# Patient Record
Sex: Female | Born: 1980 | Race: White | Hispanic: No | Marital: Single | Smoking: Former smoker
Health system: Southern US, Community
[De-identification: ages and names within clinical notes are randomized; demographics above are authoritative.]

## PROBLEM LIST (undated history)

## (undated) DIAGNOSIS — F419 Anxiety disorder, unspecified: Secondary | ICD-10-CM

## (undated) DIAGNOSIS — F4310 Post-traumatic stress disorder, unspecified: Secondary | ICD-10-CM

## (undated) HISTORY — PX: WISDOM TOOTH EXTRACTION: SHX21

---

## 2017-02-20 ENCOUNTER — Emergency Department: Payer: Self-pay

## 2017-02-20 ENCOUNTER — Emergency Department
Admission: EM | Admit: 2017-02-20 | Discharge: 2017-02-20 | Disposition: A | Discharge status: Home / Self Care | Payer: Self-pay | Attending: Emergency Medicine | Admitting: Emergency Medicine

## 2017-02-20 ENCOUNTER — Other Ambulatory Visit: Payer: Self-pay

## 2017-02-20 ENCOUNTER — Encounter: Payer: Self-pay | Admitting: Emergency Medicine

## 2017-02-20 DIAGNOSIS — J209 Acute bronchitis, unspecified: Secondary | ICD-10-CM | POA: Insufficient documentation

## 2017-02-20 DIAGNOSIS — Z87891 Personal history of nicotine dependence: Secondary | ICD-10-CM | POA: Insufficient documentation

## 2017-02-20 HISTORY — DX: Anxiety disorder, unspecified: F41.9

## 2017-02-20 HISTORY — DX: Post-traumatic stress disorder, unspecified: F43.10

## 2017-02-20 LAB — POCT PREGNANCY, URINE: Preg Test, Ur: NEGATIVE

## 2017-02-20 MED ORDER — BENZONATATE 100 MG PO CAPS
100.0000 mg | ORAL_CAPSULE | Freq: Once | ORAL | Status: AC
  Administered 2017-02-20: 100 mg via ORAL
  Filled 2017-02-20: qty 1

## 2017-02-20 MED ORDER — ALBUTEROL SULFATE HFA 108 (90 BASE) MCG/ACT IN AERS: 2.0000 | INHALATION_SPRAY | Freq: Four times a day (QID) | RESPIRATORY_TRACT | 0 refills | Status: DC | PRN

## 2017-02-20 MED ORDER — BENZONATATE 100 MG PO CAPS: 100.0000 mg | ORAL_CAPSULE | Freq: Three times a day (TID) | ORAL | 0 refills | Status: AC | PRN

## 2017-02-20 MED ORDER — AZITHROMYCIN 500 MG PO TABS
500.0000 mg | ORAL_TABLET | Freq: Once | ORAL | Status: AC
  Administered 2017-02-20: 500 mg via ORAL
  Filled 2017-02-20: qty 1

## 2017-02-20 MED ORDER — AZITHROMYCIN 250 MG PO TABS: ORAL_TABLET | ORAL | 0 refills | Status: AC

## 2017-02-20 NOTE — ED Triage Notes (Signed)
Pt presents to ED with non-productive cough and congestion intermittently for the past month. denies fever. States she just isn't feeling better. Pt currently has no increased work of breathing or acute distress noted at this time.

## 2017-02-20 NOTE — ED Notes (Signed)

## 2017-02-21 NOTE — ED Provider Notes (Signed)
[facility] Emergency Department Provider Note    First MD Initiated Contact with Patient 02/20/17 [phone]     (approximate)  I have reviewed the triage vital signs and the nursing notes.   HISTORY  Chief Complaint Cough   HPI Kathryn Perkins is a 36 y.o. female emergency department with intermittent nonproductive cough and congestion times 1 month.  Patient denies any fever.  Patient denies any chest pain.  Patient denies any lower extremity pain or swelling.  Patient does admit to using Vapor cigarette.   Past Medical History:  Diagnosis Date  . Anxiety   . PTSD (post-traumatic stress disorder)     There are no active problems to display for this patient.   Past Surgical History:  Procedure Laterality Date  . WISDOM TOOTH EXTRACTION      Prior to Admission medications   Medication Sig Start Date End Date Taking? Authorizing Provider  albuterol (PROVENTIL HFA;VENTOLIN HFA) 108 (90 Base) MCG/ACT inhaler Inhale 2 puffs every 6 (six) hours as needed into the lungs for wheezing or shortness of breath. 02/20/17   Darci CurrentBrown, [place] N, MD  azithromycin (ZITHROMAX Z-PAK) 250 MG tablet Take 2 tablets (500 mg) on  Day 1,  followed by 1 tablet (250 mg) once daily on Days 2 through 5. 02/20/17 02/25/17  Darci CurrentBrown, [place] N, MD  benzonatate (TESSALON PERLES) 100 MG capsule Take 1 capsule (100 mg total) 3 (three) times daily as needed for up to 10 days by mouth for cough. 02/20/17 03/02/17  Darci CurrentBrown, [place] N, MD    Allergies Sulfa antibiotics  History reviewed. No pertinent family history.  Social History Social History   Tobacco Use  . Smoking status: Former Games developermoker  . Smokeless tobacco: Never Used  Substance Use Topics  . Alcohol use: No    Frequency: Never  . Drug use: No    Review of Systems Constitutional: No fever/chills Eyes: No visual changes. ENT: No sore throat.  Positive for nasal congestion Cardiovascular: Denies chest pain. Respiratory:  Denies shortness of breath.  Positive for cough Gastrointestinal: No abdominal pain.  No nausea, no vomiting.  No diarrhea.  No constipation. Genitourinary: Negative for dysuria. Musculoskeletal: Negative for neck pain.  Negative for back pain. Integumentary: Negative for rash. Neurological: Negative for headaches, focal weakness or numbness.   ____________________________________________   PHYSICAL EXAM:  VITAL SIGNS: ED Triage Vitals  Enc Vitals Group     BP 02/20/17 0318 128/77     Pulse Rate 02/20/17 0318 (!) 101     Resp 02/20/17 0318 16     Temp 02/20/17 0318 98.1 F (36.7 C)     Temp Source 02/20/17 0318 Oral     SpO2 02/20/17 0318 98 %     Weight 02/20/17 0319 95.3 kg (210 lb)     Height 02/20/17 0319 1.676 m (5\' 6" )     Head Circumference --      Peak Flow --      Pain Score 02/20/17 0318 4     Pain Loc --      Pain Edu? --      Excl. in GC? --     Constitutional: Alert and oriented. Well appearing and in no acute distress. Eyes: Conjunctivae are normal. Head: Atraumatic. Nose: No congestion/rhinnorhea. Mouth/Throat: Mucous membranes are moist.  Oropharynx non-erythematous. Neck: No stridor.  No meningeal signs.   Cardiovascular: Normal rate, regular rhythm. Good peripheral circulation. Grossly normal heart sounds. Respiratory: Normal respiratory effort.  No retractions.  Lungs CTAB. Gastrointestinal: Soft and nontender. No distention.  Musculoskeletal: No lower extremity tenderness nor edema. No gross deformities of extremities. Neurologic:  Normal speech and language. No gross focal neurologic deficits are appreciated.  Skin:  Skin is warm, dry and intact. No rash noted. Psychiatric: Mood and affect are normal. Speech and behavior are normal.  ____________________________________________   LABS (all labs ordered are listed, but only abnormal results are displayed)  Labs Reviewed  POC URINE PREG, ED  POCT PREGNANCY, URINE    _____________________________  RADIOLOGY I, Eleva N BROWN, personally viewed and evaluated these images (plain radiographs) as part of my medical decision making, as well as reviewing the written report by the radiologist.  CLINICAL DATA:  36 year old female with cough.  EXAM: CHEST  2 VIEW  COMPARISON:  None.  FINDINGS: The heart size and mediastinal contours are within normal limits. Both lungs are clear. The visualized skeletal structures are unremarkable.  IMPRESSION: No active cardiopulmonary disease.   Electronically Signed   By: Elgie CollardArash  Radparvar M.D.   On: 02/20/2017 03:51 ________________  Procedures   ____________________________________________   INITIAL IMPRESSION / ASSESSMENT AND PLAN / ED COURSE  As part of my medical decision making, I reviewed the following data within the electronic MEDICAL RECORD [id number] year old female presenting with intermittent cough times 1 month.  Patient afebrile on presentation.  Suspect possible chronic bronchitis as etiology for the patient's cough.  Consider the possibility of pneumonia chest x-ray performed revealed no acute findings.  Also considered possibility of pertussis.  Patient given azithromycin in the emergency department and will be prescribed the same for home ____________________________________________  FINAL CLINICAL IMPRESSION(S) / ED DIAGNOSES  Final diagnoses:  Acute bronchitis, unspecified organism     MEDICATIONS GIVEN DURING THIS VISIT:  Medications  azithromycin (ZITHROMAX) tablet 500 mg (500 mg Oral Given 02/20/17 0509)  benzonatate (TESSALON) capsule 100 mg (100 mg Oral Given 02/20/17 0509)     ED Discharge Orders        Ordered    azithromycin (ZITHROMAX Z-PAK) 250 MG tablet     02/20/17 0442    benzonatate (TESSALON PERLES) 100 MG capsule  3 times daily PRN     02/20/17 0442    albuterol (PROVENTIL HFA;VENTOLIN HFA) 108 (90 Base) MCG/ACT inhaler  Every 6 hours PRN     02/20/17  0442       Note:  This document was prepared using Dragon voice recognition software and may include unintentional dictation errors.    Darci CurrentBrown, [place] N, MD 02/21/17 [phone]

## 2018-05-09 IMAGING — CR DG CHEST 2V
2 series · 2 of 2 positions shown · non-contrast
Comparison: None.

CLINICAL DATA: 36-year-old female with cough.

EXAM:
CHEST  2 VIEW

[chest pa]
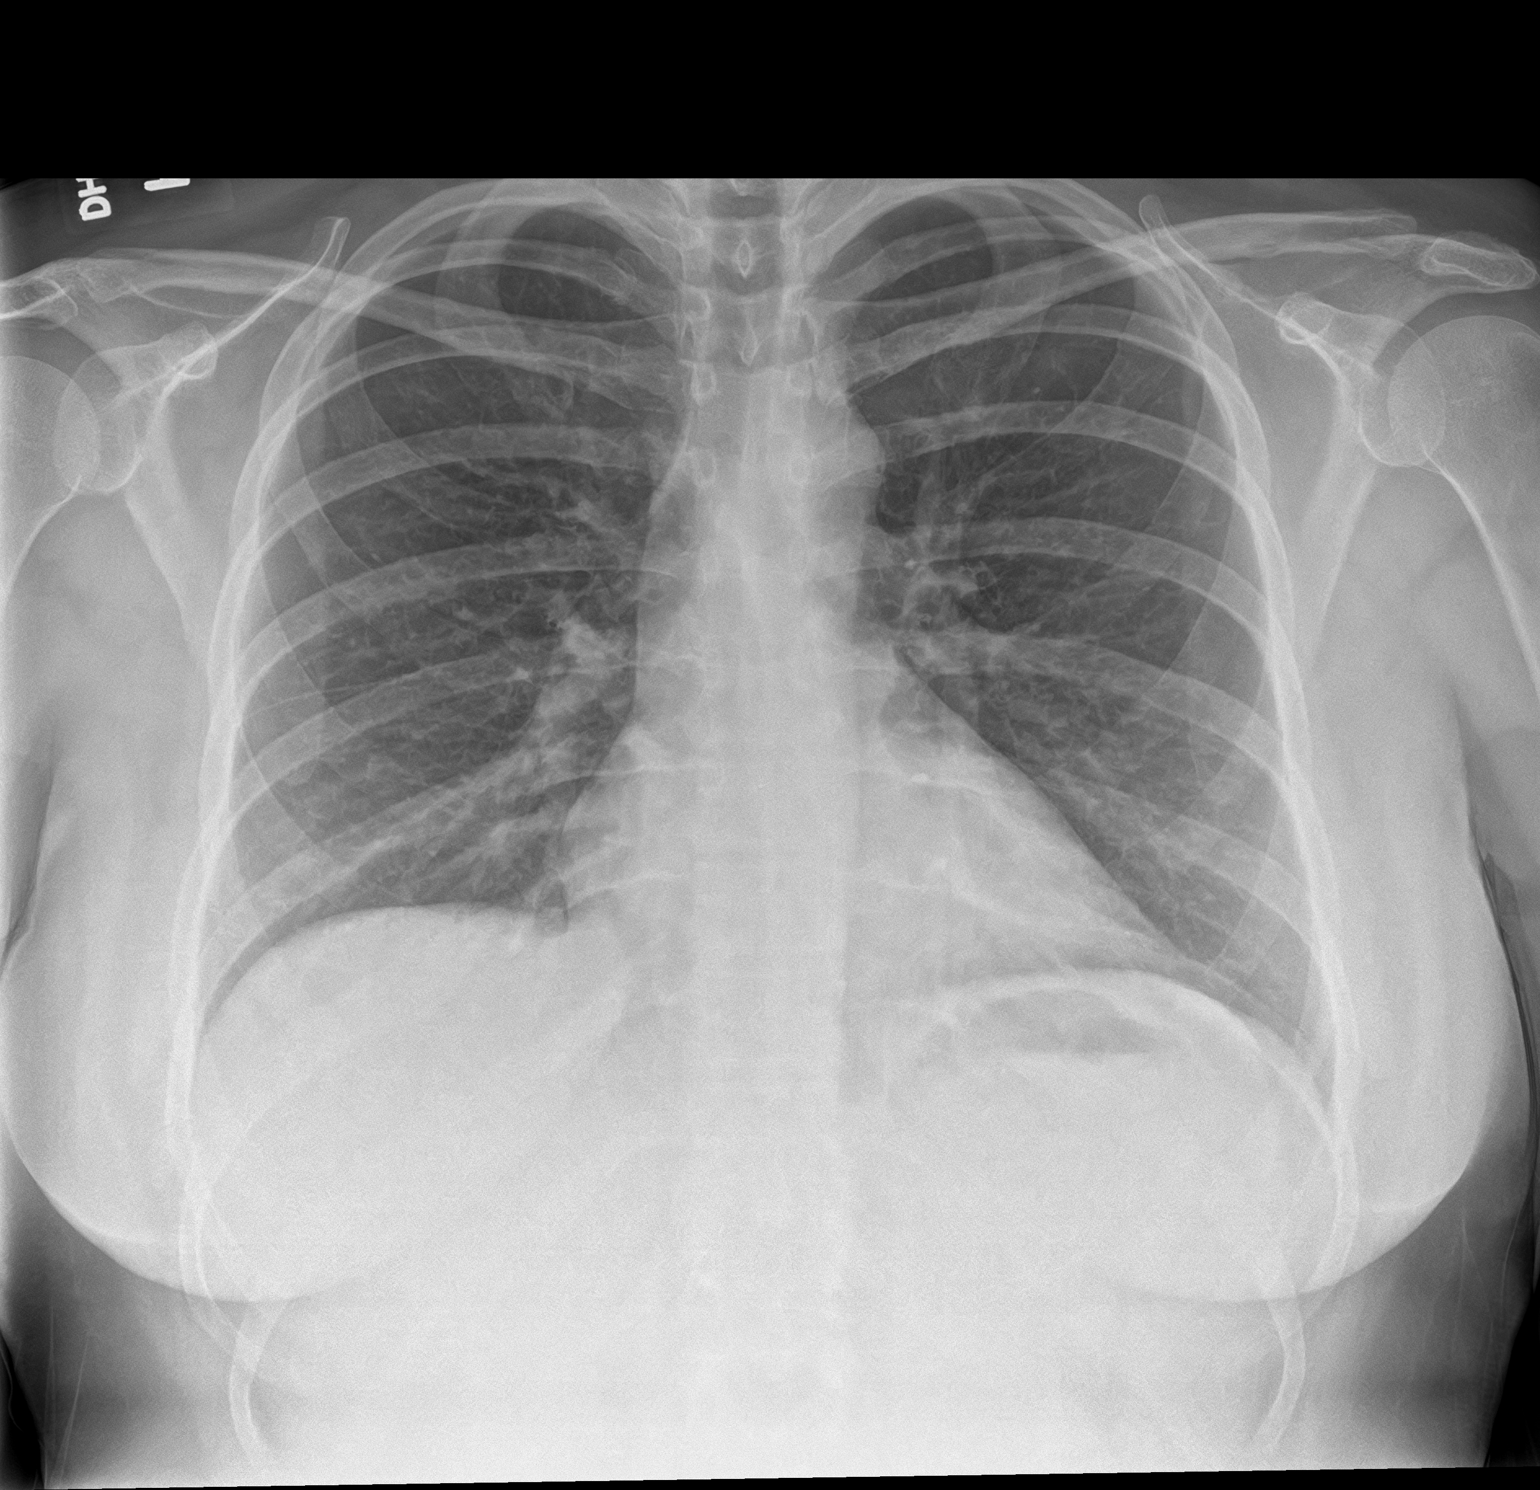

[chest lat]
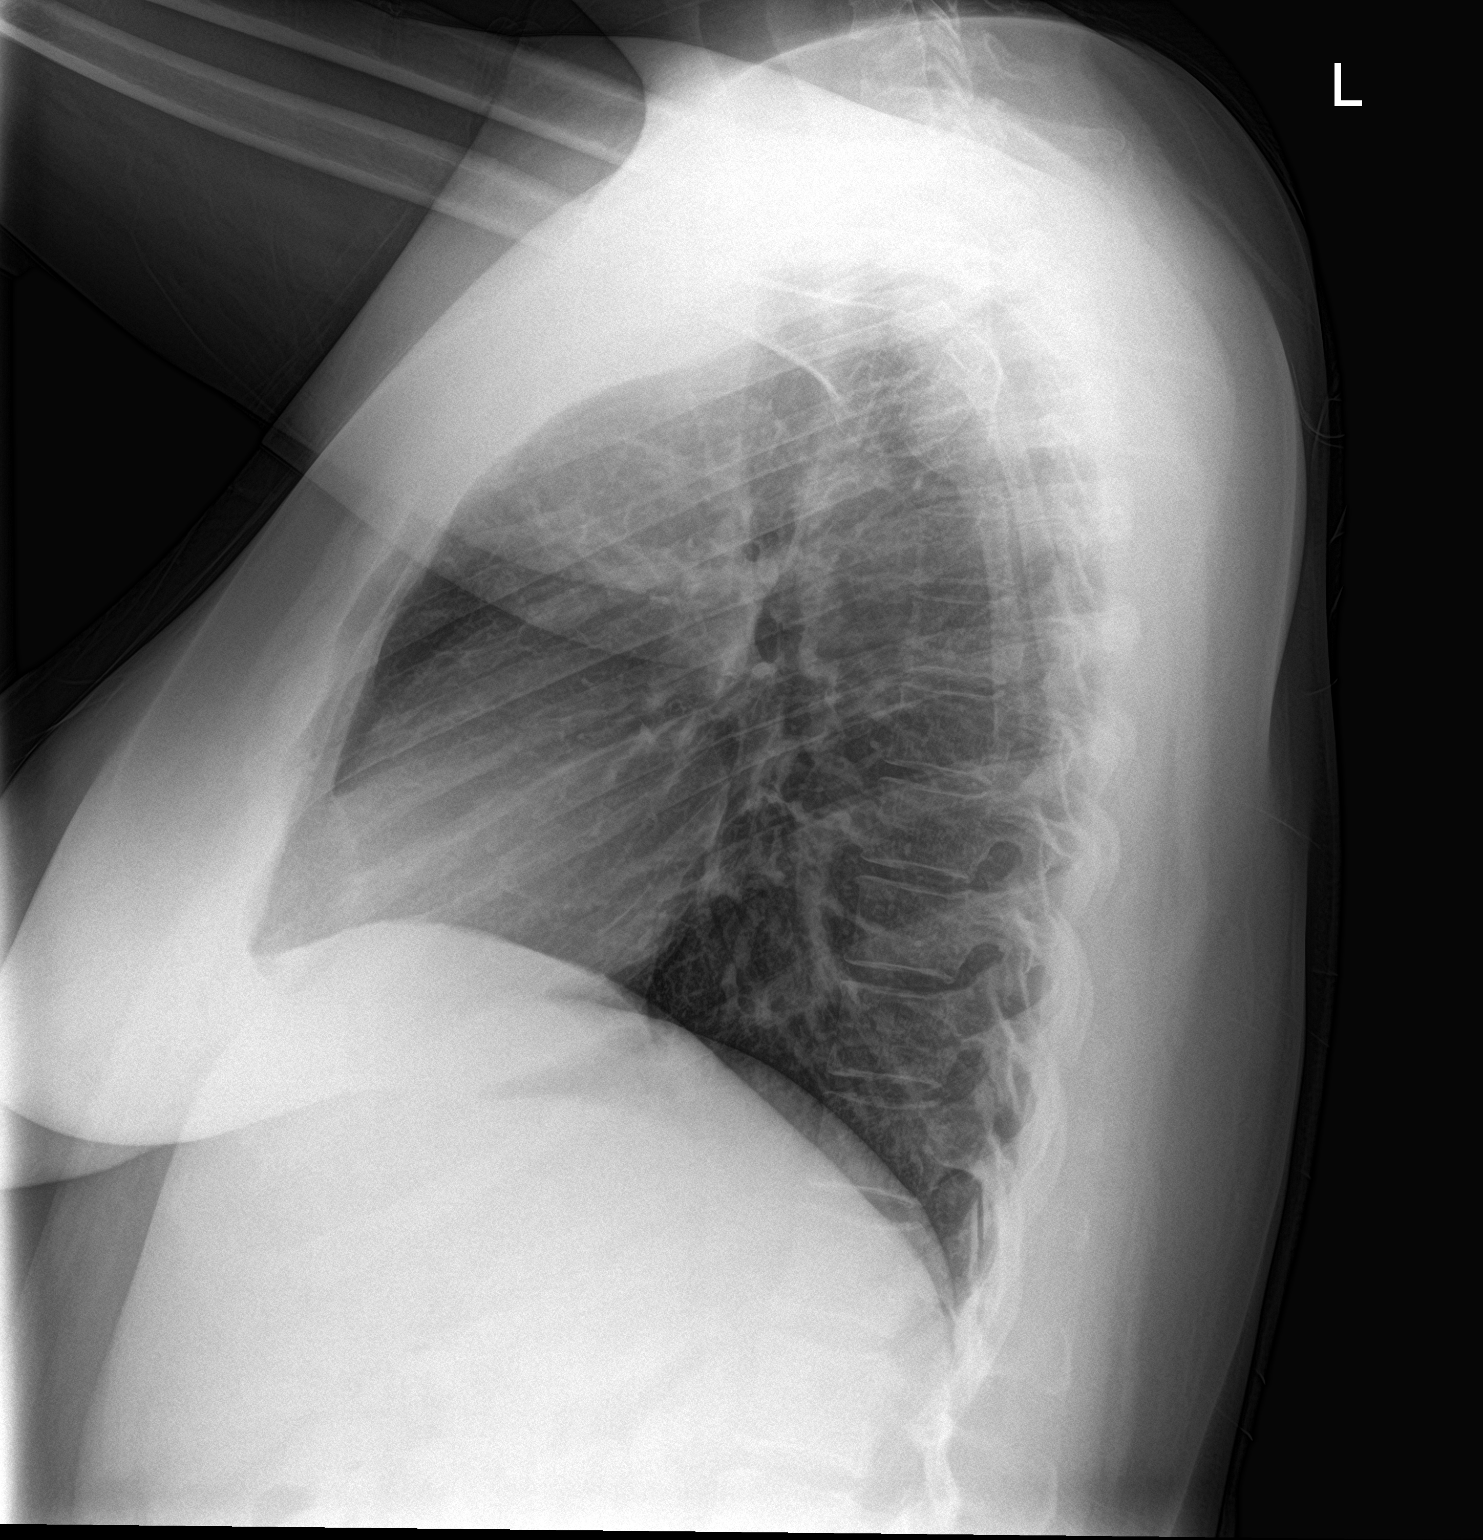

[2 of 2 positions shown; findings below may reference images not displayed]

FINDINGS: The heart size and mediastinal contours are within normal limits.
Both lungs are clear. The visualized skeletal structures are
unremarkable.
IMPRESSION: No active cardiopulmonary disease.

## 2018-07-10 ENCOUNTER — Telehealth: Payer: Self-pay | Admitting: Physician Assistant

## 2018-07-10 DIAGNOSIS — B349 Viral infection, unspecified: Secondary | ICD-10-CM

## 2018-07-10 MED ORDER — BENZONATATE 100 MG PO CAPS: 100.0000 mg | ORAL_CAPSULE | Freq: Three times a day (TID) | ORAL | 0 refills | Status: AC | PRN

## 2018-07-10 MED ORDER — ALBUTEROL SULFATE HFA 108 (90 BASE) MCG/ACT IN AERS: 2.0000 | INHALATION_SPRAY | Freq: Four times a day (QID) | RESPIRATORY_TRACT | 0 refills | Status: AC | PRN

## 2018-07-10 NOTE — Progress Notes (Signed)
I have spent 5 minutes in review of e-visit questionnaire, review and updating patient chart, medical decision making and response to patient.   Vamsi Apfel Cody Dante Roudebush, PA-C    

## 2018-07-10 NOTE — Progress Notes (Signed)
E-Visit for Corona Virus Screening  Based on your current symptoms, you may very well have the virus, however your symptoms are mild. Currently, not all patients are being tested. If the symptoms are mild and there is not a known exposure, performing the test is not indicated.  Coronavirus disease 2019 (COVID-19) is a respiratory illness that can spread from person to person. The virus that causes COVID-19 is a new virus that was first identified in the country of [place] but is now found in multiple other countries and has spread to the [place].  Symptoms associated with the virus are mild to severe fever, cough, and shortness of breath. There is currently no vaccine to protect against COVID-19, and there is no specific antiviral treatment for the virus.   To be considered HIGH RISK for Coronavirus (COVID-19), you have to meet the following criteria:  . Traveled to [place], [place], [place], [place] or [place]; or in the [place] to [place], [place], [place], or [place]; and have fever, cough, and shortness of breath within the last 2 weeks of travel OR  . Been in close contact with a person diagnosed with COVID-19 within the last 2 weeks and have fever, cough, and shortness of breath  . IF YOU DO NOT MEET THESE CRITERIA, YOU ARE CONSIDERED LOW RISK FOR COVID-19.   It is vitally important that if you feel that you have an infection such as this virus or any other virus that you stay home and away from places where you may spread it to others.  You should self-quarantine for 14 days if you have symptoms that could potentially be coronavirus and avoid contact with people age 23 and older.   You can use medication such as A prescription cough medication called Tessalon Perles 100 mg. You may take 1-2 capsules every 8 hours as needed for cough . A prescription has been sent in.  I see an old prescription for albuterol inhaler in your chart. Do you still have this? I have sent a refill to  the pharmacy just in case. Only pick up if you need!  You may also take acetaminophen (Tylenol) as needed for fever.   Reduce your risk of any infection by using the same precautions used for avoiding the common cold or flu:  Marland Kitchen Wash your hands often with soap and warm water for at least 20 seconds.  If soap and water are not readily available, use an alcohol-based hand sanitizer with at least 60% alcohol.  . If coughing or sneezing, cover your mouth and nose by coughing or sneezing into the elbow areas of your shirt or coat, into a tissue or into your sleeve (not your hands). . Avoid shaking hands with others and consider head nods or verbal greetings only. . Avoid touching your eyes, nose, or mouth with unwashed hands.  . Avoid close contact with people who are sick. . Avoid places or events with large numbers of people in one location, like concerts or sporting events. . Carefully consider travel plans you have or are making. . If you are planning any travel outside or inside the [place], visit the CDC's Travelers' Health webpage for the latest health notices. . If you have some symptoms but not all symptoms, continue to monitor at home and seek medical attention if your symptoms worsen. . If you are having a medical emergency, call 911.  HOME CARE . Only take medications as instructed by your medical team. . Drink  plenty of fluids and get plenty of rest. . A steam or ultrasonic humidifier can help if you have congestion.   GET HELP RIGHT AWAY IF: . You develop worsening fever. . You become short of breath . You cough up blood. . Your symptoms become more severe MAKE SURE YOU   Understand these instructions.  Will watch your condition.  Will get help right away if you are not doing well or get worse.  Your e-visit answers were reviewed by a board certified advanced clinical practitioner to complete your personal care plan.  Depending on the condition, your plan could have included  both over the counter or prescription medications.  If there is a problem please reply once you have received a response from your provider. Your safety is important to [place].  If you have drug allergies check your prescription carefully.    You can use MyChart to ask questions about today's visit, request a non-urgent call back, or ask for a work or school excuse for 24 hours related to this e-Visit. If it has been greater than 24 hours you will need to follow up with your provider, or enter a new e-Visit to address those concerns. You will get an e-mail in the next two days asking about your experience.  I hope that your e-visit has been valuable and will speed your recovery. Thank you for using e-visits.
